# Patient Record
Sex: Female | Born: 1975 | Race: White | Hispanic: No | Smoking: Current every day smoker
Health system: Southern US, Community
[De-identification: ages and names within clinical notes are randomized; demographics above are authoritative.]

## PROBLEM LIST (undated history)

## (undated) DIAGNOSIS — D259 Leiomyoma of uterus, unspecified: Secondary | ICD-10-CM

## (undated) DIAGNOSIS — J45909 Unspecified asthma, uncomplicated: Secondary | ICD-10-CM

## (undated) HISTORY — PX: TUBAL LIGATION: SHX77

## (undated) HISTORY — PX: CHOLECYSTECTOMY: SHX55

## (undated) HISTORY — PX: ABDOMINAL HYSTERECTOMY: SHX81

---

## 2007-04-19 ENCOUNTER — Emergency Department (HOSPITAL_COMMUNITY): Admission: EM | Admit: 2007-04-19 | Discharge: 2007-04-19 | Payer: Self-pay | Admitting: Emergency Medicine

## 2007-05-02 ENCOUNTER — Emergency Department (HOSPITAL_COMMUNITY): Admission: EM | Admit: 2007-05-02 | Discharge: 2007-05-03 | Payer: Self-pay | Admitting: Emergency Medicine

## 2010-07-23 ENCOUNTER — Emergency Department (HOSPITAL_COMMUNITY)
Admission: EM | Admit: 2010-07-23 | Discharge: 2010-07-23 | Disposition: A | Discharge status: Home / Self Care | Payer: Self-pay | Attending: Emergency Medicine | Admitting: Emergency Medicine

## 2010-07-23 DIAGNOSIS — F17200 Nicotine dependence, unspecified, uncomplicated: Secondary | ICD-10-CM | POA: Insufficient documentation

## 2010-07-23 DIAGNOSIS — K219 Gastro-esophageal reflux disease without esophagitis: Secondary | ICD-10-CM | POA: Insufficient documentation

## 2010-07-23 DIAGNOSIS — S025XXA Fracture of tooth (traumatic), initial encounter for closed fracture: Secondary | ICD-10-CM | POA: Insufficient documentation

## 2010-07-23 DIAGNOSIS — X58XXXA Exposure to other specified factors, initial encounter: Secondary | ICD-10-CM | POA: Insufficient documentation

## 2014-01-18 ENCOUNTER — Encounter (HOSPITAL_COMMUNITY): Payer: Self-pay | Admitting: Emergency Medicine

## 2014-01-18 ENCOUNTER — Emergency Department (HOSPITAL_COMMUNITY)
Admission: EM | Admit: 2014-01-18 | Discharge: 2014-01-18 | Disposition: A | Discharge status: Home / Self Care | Payer: Self-pay | Attending: Emergency Medicine | Admitting: Emergency Medicine

## 2014-01-18 DIAGNOSIS — Z3202 Encounter for pregnancy test, result negative: Secondary | ICD-10-CM | POA: Insufficient documentation

## 2014-01-18 DIAGNOSIS — Z8742 Personal history of other diseases of the female genital tract: Secondary | ICD-10-CM | POA: Insufficient documentation

## 2014-01-18 DIAGNOSIS — M545 Low back pain, unspecified: Secondary | ICD-10-CM

## 2014-01-18 DIAGNOSIS — Z9049 Acquired absence of other specified parts of digestive tract: Secondary | ICD-10-CM | POA: Insufficient documentation

## 2014-01-18 DIAGNOSIS — Z86018 Personal history of other benign neoplasm: Secondary | ICD-10-CM

## 2014-01-18 DIAGNOSIS — G8929 Other chronic pain: Secondary | ICD-10-CM | POA: Insufficient documentation

## 2014-01-18 DIAGNOSIS — N939 Abnormal uterine and vaginal bleeding, unspecified: Secondary | ICD-10-CM | POA: Insufficient documentation

## 2014-01-18 DIAGNOSIS — Z720 Tobacco use: Secondary | ICD-10-CM | POA: Insufficient documentation

## 2014-01-18 DIAGNOSIS — Z9851 Tubal ligation status: Secondary | ICD-10-CM | POA: Insufficient documentation

## 2014-01-18 DIAGNOSIS — Z79899 Other long term (current) drug therapy: Secondary | ICD-10-CM | POA: Insufficient documentation

## 2014-01-18 HISTORY — DX: Unspecified asthma, uncomplicated: J45.909

## 2014-01-18 HISTORY — DX: Leiomyoma of uterus, unspecified: D25.9

## 2014-01-18 LAB — URINALYSIS, ROUTINE W REFLEX MICROSCOPIC
Bilirubin Urine: NEGATIVE
Glucose, UA: NEGATIVE mg/dL
Ketones, ur: NEGATIVE mg/dL
LEUKOCYTES UA: NEGATIVE
NITRITE: NEGATIVE
PH: 5.5 (ref 5.0–8.0)
Protein, ur: NEGATIVE mg/dL
Specific Gravity, Urine: <1.005 — ABNORMAL LOW (ref 1.005–1.030)
UROBILINOGEN UA: 0.2 mg/dL (ref 0.0–1.0)

## 2014-01-18 LAB — PREGNANCY, URINE: PREG TEST UR: NEGATIVE

## 2014-01-18 LAB — URINE MICROSCOPIC-ADD ON

## 2014-01-18 MED ORDER — OXYCODONE-ACETAMINOPHEN 5-325 MG PO TABS
1.0000 | ORAL_TABLET | Freq: Once | ORAL | Status: AC
  Administered 2014-01-18: 1 via ORAL
  Filled 2014-01-18: qty 1

## 2014-01-18 MED ORDER — HYDROCODONE-ACETAMINOPHEN 5-325 MG PO TABS: ORAL_TABLET | ORAL | Status: DC

## 2014-01-18 NOTE — ED Notes (Signed)
C/o uterine cramps since this am . Started period today. Told she had uterine fibroids and each menstrual cycle reports pain increase.

## 2014-01-18 NOTE — ED Provider Notes (Signed)
CSN: [id number]     Arrival date & time 01/18/14  1022 History   First MD Initiated Contact with Patient 01/18/14 1132     Chief Complaint  Patient presents with  . Pelvic Pain     (Consider location/radiation/quality/duration/timing/severity/associated sxs/prior Treatment) HPI   Terri Cohen is a 38 y.o. female with h/o chronic pelvic pain, who presents to the Emergency Department complaining of increased pelvic pain and low back pain since this morning.  She states that she has uterine fibroids and irregular periods and increased pain with menstruation each month.  She states that she was initially diagnosed at another hospital and the fibroids were confirmed by CT.  She has also seen a gynecologist for this and advised to have a  hysterectomy but she states that she cannot afford it and she is currently waiting to hear if she has been approved for financial assistance from [facility].  She states the pain is similar to previous and not relieved by OTC medications. She reports her bleeding as minimal at present.  She denies fever, abdominal pain, dysuria, vomiting incontinence of bladder or bowel, lower extremity numbness, or chills.     Past Medical History  Diagnosis Date  . Uterine fibroid   . Asthma    Past Surgical History  Procedure Laterality Date  . Tubal ligation    . Cholecystectomy     No family history on file. History  Substance Use Topics  . Smoking status: Current Every Day Smoker -- 0.50 packs/day    Types: Cigarettes  . Smokeless tobacco: Not on file  . Alcohol Use: No   OB History   Grav Para Term Preterm Abortions TAB SAB Ect Mult Living                 Review of Systems  Constitutional: Negative for fever, chills, activity change and appetite change.  Respiratory: Negative for chest tightness.   Cardiovascular: Negative for chest pain.  Gastrointestinal: Negative for nausea, vomiting and abdominal pain.  Genitourinary: Positive for vaginal bleeding,  menstrual problem and pelvic pain. Negative for dysuria, frequency and vaginal discharge.  Musculoskeletal: Positive for back pain.  Skin: Negative for rash.  Neurological: Negative for dizziness, weakness, numbness and headaches.  All other systems reviewed and are negative.     Allergies  Morphine and related and Sulfa antibiotics  Home Medications   Prior to Admission medications   Medication Sig Start Date End Date Taking? Authorizing Provider  ranitidine (ZANTAC 150 MAXIMUM STRENGTH) 150 MG tablet Take 150 mg by mouth 2 (two) times daily.   Yes Historical Provider, MD  HYDROcodone-acetaminophen (NORCO/VICODIN) 5-325 MG per tablet Take one-two tabs po q 4-6 hrs prn pain 01/18/14   Jair Lindblad L. Vanessa Calvin, PA-C   LMP 01/18/2014 Physical Exam  Nursing note and vitals reviewed. Constitutional: She is oriented to person, place, and time. She appears well-developed and well-nourished. No distress.  HENT:  Head: Normocephalic and atraumatic.  Neck: Normal range of motion. Neck supple.  Cardiovascular: Normal rate, regular rhythm, normal heart sounds and intact distal pulses.   No murmur heard. Pulmonary/Chest: Effort normal and breath sounds normal. No respiratory distress.  Abdominal: Soft. She exhibits no distension and no mass. There is no tenderness. There is no rebound and no guarding.  Musculoskeletal: Normal range of motion. She exhibits tenderness. She exhibits no edema.       Lumbar back: She exhibits tenderness and pain. She exhibits normal range of motion, no swelling, no deformity, no  laceration and normal pulse.  ttp of the bilateral  lumbar paraspinal muscles.  No spinal tenderness.  DP pulses are brisk and symmetrical.  Distal sensation intact.  Hip Flexors/Extensors are intact.  Pt has 5/5 strength against resistance of bilateral lower extremities.     Neurological: She is alert and oriented to person, place, and time. She has normal strength. No sensory deficit. She exhibits  normal muscle tone. Coordination and gait normal.  Reflex Scores:      Patellar reflexes are 2+ on the right side and 2+ on the left side.      Achilles reflexes are 2+ on the right side and 2+ on the left side. Skin: Skin is warm and dry. No rash noted.    ED Course  Procedures (including critical care time) Labs Review Labs Reviewed  URINALYSIS, ROUTINE W REFLEX MICROSCOPIC - Abnormal; Notable for the following:    Specific Gravity, Urine <1.005 (*)    Hgb urine dipstick SMALL (*)    All other components within normal limits  URINE MICROSCOPIC-ADD ON  PREGNANCY, URINE    Imaging Review No results found.   EKG Interpretation None      MDM   Final diagnoses:  History of uterine fibroid  Bilateral low back pain without sciatica    Patient has documented uterine fibroids seen on CT scan from 12/25/13 done at [facility].  Pt seen for this by OB/GYN in [facility] and recommended to have hysterectomy, but pt doesn't have insurance and cannot afford it.  She states the she is waiting for approval through [facility] assistance to f/u with [facility].  Pt is well appearing, non-toxic, ambulates with steady gait, no focal neuro deficits, no concerning sx's for acute abdomen or TOA.  Pain today is similar to previous symptoms and associated with onset of menses today therefore pelvic exam was deferred, but patient agrees to close f/u with GYN and she was advised to return here if sx's worsen.  Appears stable for d/c    Maryse Brierley L. Vanessa McCurtain, PA-C 01/20/14 1217

## 2014-01-18 NOTE — ED Notes (Signed)
Patient given discharge instruction, verbalized understand. Patient ambulatory out of the department.  

## 2014-01-21 NOTE — ED Provider Notes (Signed)
Medical screening examination/treatment/procedure(s) were conducted as a shared visit with non-physician practitioner(s) and myself.  I personally evaluated the patient during the encounter.   EKG Interpretation None     Patient has known uterine fibroids. She is waiting for gynecological followup. No acute abdomen. Pain is similar to the pain she has had in the past.  Nat Christen, MD 01/21/14 1055

## 2014-04-05 ENCOUNTER — Emergency Department (HOSPITAL_COMMUNITY)
Admission: EM | Admit: 2014-04-05 | Discharge: 2014-04-05 | Disposition: A | Discharge status: Home / Self Care | Payer: Self-pay | Attending: Emergency Medicine | Admitting: Emergency Medicine

## 2014-04-05 ENCOUNTER — Encounter (HOSPITAL_COMMUNITY): Payer: Self-pay | Admitting: Emergency Medicine

## 2014-04-05 DIAGNOSIS — Z8742 Personal history of other diseases of the female genital tract: Secondary | ICD-10-CM | POA: Insufficient documentation

## 2014-04-05 DIAGNOSIS — R5383 Other fatigue: Secondary | ICD-10-CM | POA: Insufficient documentation

## 2014-04-05 DIAGNOSIS — Z791 Long term (current) use of non-steroidal anti-inflammatories (NSAID): Secondary | ICD-10-CM | POA: Insufficient documentation

## 2014-04-05 DIAGNOSIS — R630 Anorexia: Secondary | ICD-10-CM | POA: Insufficient documentation

## 2014-04-05 DIAGNOSIS — J40 Bronchitis, not specified as acute or chronic: Secondary | ICD-10-CM

## 2014-04-05 DIAGNOSIS — Z720 Tobacco use: Secondary | ICD-10-CM | POA: Insufficient documentation

## 2014-04-05 DIAGNOSIS — Z79899 Other long term (current) drug therapy: Secondary | ICD-10-CM | POA: Insufficient documentation

## 2014-04-05 DIAGNOSIS — J45901 Unspecified asthma with (acute) exacerbation: Secondary | ICD-10-CM | POA: Insufficient documentation

## 2014-04-05 DIAGNOSIS — R0789 Other chest pain: Secondary | ICD-10-CM | POA: Insufficient documentation

## 2014-04-05 MED ORDER — ALBUTEROL SULFATE (2.5 MG/3ML) 0.083% IN NEBU
2.5000 mg | INHALATION_SOLUTION | Freq: Once | RESPIRATORY_TRACT | Status: AC
  Administered 2014-04-05: 2.5 mg via RESPIRATORY_TRACT
  Filled 2014-04-05: qty 3

## 2014-04-05 MED ORDER — IPRATROPIUM-ALBUTEROL 0.5-2.5 (3) MG/3ML IN SOLN
3.0000 mL | Freq: Once | RESPIRATORY_TRACT | Status: AC
  Administered 2014-04-05: 3 mL via RESPIRATORY_TRACT
  Filled 2014-04-05: qty 3

## 2014-04-05 MED ORDER — CLARITHROMYCIN 500 MG PO TABS: 500.0000 mg | ORAL_TABLET | Freq: Two times a day (BID) | ORAL | Status: DC

## 2014-04-05 MED ORDER — PROMETHAZINE-CODEINE 6.25-10 MG/5ML PO SYRP: 5.0000 mL | ORAL_SOLUTION | ORAL | Status: DC | PRN

## 2014-04-05 MED ORDER — PREDNISONE 50 MG PO TABS
60.0000 mg | ORAL_TABLET | Freq: Once | ORAL | Status: AC
  Administered 2014-04-05: 60 mg via ORAL
  Filled 2014-04-05 (×2): qty 1

## 2014-04-05 NOTE — ED Notes (Signed)
PA at bedside.

## 2014-04-05 NOTE — ED Notes (Signed)
Pt c/o productive cough 2-3 months with yellow/green sputum, general malaise, nausea and poor appetite.

## 2014-04-05 NOTE — ED Notes (Signed)
RT paged about breathing treatment

## 2014-04-05 NOTE — ED Provider Notes (Signed)
CSN: [id number]     Arrival date & time 04/05/14  1005 History   First MD Initiated Contact with Patient 04/05/14 1010     Chief Complaint  Patient presents with  . Cough     (Consider location/radiation/quality/duration/timing/severity/associated sxs/prior Treatment) HPI   Terri Cohen is a 38 y.o. female who presents to the Emergency Department complaining of cough for 2 months.  She states the cough is persistent and occasionally productive of yellow to green sputum.  She states the cough is worse with lying down. She also c/o generalized fatigue, nasal congestion, nausea without vomiting, and decreased appetite.  She states that she has taken OTC cold and cough medications without relief.  She has also been using her albuterol inhaler without relief.  She denies fever, chills, vomiting, bloody sputum, shortness of breath of chest pain.     Past Medical History  Diagnosis Date  . Uterine fibroid   . Asthma    Past Surgical History  Procedure Laterality Date  . Tubal ligation    . Cholecystectomy     No family history on file. History  Substance Use Topics  . Smoking status: Current Every Day Smoker -- 0.50 packs/day    Types: Cigarettes  . Smokeless tobacco: Not on file  . Alcohol Use: No   OB History    No data available     Review of Systems  Constitutional: Positive for appetite change and fatigue. Negative for fever and chills.  HENT: Positive for congestion. Negative for sinus pressure, sore throat and trouble swallowing.   Respiratory: Positive for cough, chest tightness and wheezing. Negative for shortness of breath.   Cardiovascular: Negative for chest pain.  Gastrointestinal: Negative for nausea, vomiting and abdominal pain.  Genitourinary: Negative for dysuria.  Musculoskeletal: Negative for arthralgias, neck pain and neck stiffness.  Skin: Negative for rash.  Neurological: Negative for dizziness, weakness and numbness.  Hematological: Negative for  adenopathy.  All other systems reviewed and are negative.     Allergies  Sulfa antibiotics  Home Medications   Prior to Admission medications   Medication Sig Start Date End Date Taking? Authorizing Provider  acetaminophen (TYLENOL) 500 MG tablet Take 1,000 mg by mouth every 6 (six) hours as needed for moderate pain.   Yes Historical Provider, MD  ranitidine (ZANTAC 150 MAXIMUM STRENGTH) 150 MG tablet Take 150 mg by mouth 2 (two) times daily.   Yes Historical Provider, MD  vitamin B-12 (CYANOCOBALAMIN) 1000 MCG tablet Take 1,000 mcg by mouth daily.   Yes Historical Provider, MD  HYDROcodone-acetaminophen (NORCO/VICODIN) 5-325 MG per tablet Take one-two tabs po q 4-6 hrs prn pain Patient not taking: Reported on 04/05/2014 01/18/14   Hollyann Pablo L. Donyelle Enyeart, PA-C   BP 136/80 mmHg  Pulse 70  Temp(Src) 98.1 F (36.7 C)  Resp 22  Ht 5\' 4"  (1.626 m)  Wt 170 lb (77.111 kg)  BMI 29.17 kg/m2  SpO2 99%  LMP 03/12/2014 Physical Exam  Constitutional: She is oriented to person, place, and time. She appears well-developed and well-nourished. No distress.  HENT:  Head: Normocephalic and atraumatic.  Mouth/Throat: Oropharynx is clear and moist. No oropharyngeal exudate.  Neck: Normal range of motion. Neck supple.  Pulmonary/Chest: Effort normal. No respiratory distress. She has wheezes. She has no rales. She exhibits no tenderness.  Course lung sounds bilaterally with occasional expiratory wheezes bilaterally.  No rales  Abdominal: Soft. She exhibits no distension. There is no tenderness. There is no rebound and no guarding.  Musculoskeletal: Normal range of motion.  Lymphadenopathy:    She has no cervical adenopathy.  Neurological: She is alert and oriented to person, place, and time. She exhibits normal muscle tone. Coordination normal.  Skin: Skin is warm and dry. No rash noted.  Psychiatric: She has a normal mood and affect.  Nursing note and vitals reviewed.   ED Course  Procedures  (including critical care time) Labs Review Labs Reviewed - No data to display  Imaging Review No results found.   EKG Interpretation None      MDM   Final diagnoses:  Bronchitis    Vitals are stable.  No hypoxia, tachypnea or tachycardia.  Lung sounds improved after neb and prednisone and pt is ready for discharge.  Pt has her own albuterol MDI.  Agrees to 2 puffs every 4 hrs as needed, phenergan codeine cough syrup, and biaxin. Advised to f/u with her PMD or to return here for any worsening symptoms.  She agrees to plan and verbalized understanding.    Terri Cohen 04/06/14 [place], MD 04/06/14 [place]ly [place]

## 2015-07-02 ENCOUNTER — Emergency Department (HOSPITAL_COMMUNITY): Payer: Self-pay

## 2015-07-02 ENCOUNTER — Encounter (HOSPITAL_COMMUNITY): Payer: Self-pay | Admitting: Emergency Medicine

## 2015-07-02 ENCOUNTER — Emergency Department (HOSPITAL_COMMUNITY)
Admission: EM | Admit: 2015-07-02 | Discharge: 2015-07-02 | Disposition: A | Discharge status: Home / Self Care | Payer: Self-pay | Attending: Emergency Medicine | Admitting: Emergency Medicine

## 2015-07-02 DIAGNOSIS — J40 Bronchitis, not specified as acute or chronic: Secondary | ICD-10-CM

## 2015-07-02 DIAGNOSIS — J45909 Unspecified asthma, uncomplicated: Secondary | ICD-10-CM | POA: Insufficient documentation

## 2015-07-02 DIAGNOSIS — F17210 Nicotine dependence, cigarettes, uncomplicated: Secondary | ICD-10-CM | POA: Insufficient documentation

## 2015-07-02 DIAGNOSIS — J449 Chronic obstructive pulmonary disease, unspecified: Secondary | ICD-10-CM | POA: Insufficient documentation

## 2015-07-02 MED ORDER — CEPHALEXIN 500 MG PO CAPS: 500.0000 mg | ORAL_CAPSULE | Freq: Four times a day (QID) | ORAL | Status: DC

## 2015-07-02 MED ORDER — PREDNISONE 10 MG PO TABS: ORAL_TABLET | ORAL | Status: DC

## 2015-07-02 MED ORDER — IPRATROPIUM-ALBUTEROL 0.5-2.5 (3) MG/3ML IN SOLN: 3.0000 mL | Freq: Four times a day (QID) | RESPIRATORY_TRACT | Status: DC

## 2015-07-02 MED ORDER — PREDNISONE 50 MG PO TABS: 60.0000 mg | ORAL_TABLET | Freq: Every day | ORAL | Status: DC

## 2015-07-02 NOTE — ED Notes (Signed)
Pt refused Duoneb and prednisone.  Pt did not want to wait for respiratory

## 2015-07-02 NOTE — Discharge Instructions (Signed)

## 2015-07-02 NOTE — ED Provider Notes (Signed)
CSN: [id number]     Arrival date & time 07/02/15  1240 History  By signing my name below, I, Terri Cohen, attest that this documentation has been prepared under the direction and in the presence of Terri Hashimoto, PA-C. Electronically Signed: Meriel Cohen, ED Scribe. 07/02/2015. 2:15 PM.   Chief Complaint  Patient presents with  . Cough   Patient is a 40 y.o. female presenting with cough. The history is provided by the patient. No language interpreter was used.  Cough Severity:  Moderate Duration: 6 months. Timing:  Constant Progression:  Unchanged Smoker: yes   Context: smoke exposure   Relieved by:  Nothing Ineffective treatments:  Cough suppressants and beta-agonist inhaler Associated symptoms: no chills and no fever     HPI Comments: Terri Cohen is a 40 y.o. female, with a h/o asthma and bronchitis, who presents to the Emergency Department complaining of a cough that has been persistent for 6 months, that is worse at night and interferes with sleep.  Pt reports she was diagnosed with pertussis 'a few months ago' and was subsequently re-vaccinated. She is a current daily smoker. Pt has an albuterol inhaler at home that has not provided significant relief. She also notes OTC cough suppressants have not provided relief. She denies fevers or chills. She is not followed by a PCP. Sulfa allergy.   Past Medical History  Diagnosis Date  . Uterine fibroid   . Asthma    Past Surgical History  Procedure Laterality Date  . Tubal ligation    . Cholecystectomy    . Abdominal hysterectomy      partial   No family history on file. Social History  Substance Use Topics  . Smoking status: Current Every Day Smoker -- 1.00 packs/day    Types: Cigarettes  . Smokeless tobacco: None  . Alcohol Use: Yes     Comment: occ   OB History    No data available     Review of Systems  Constitutional: Negative for fever and chills.  Respiratory: Positive for cough.   All other  systems reviewed and are negative.  Allergies  Sulfa antibiotics  Home Medications   Prior to Admission medications   Medication Sig Start Date End Date Taking? Authorizing Provider  acetaminophen (TYLENOL) 500 MG tablet Take 1,000 mg by mouth every 6 (six) hours as needed for moderate pain.    Historical Provider, MD  cephALEXin (KEFLEX) 500 MG capsule Take 1 capsule (500 mg total) by mouth 4 (four) times daily. 07/02/15   Fransico Meadow, PA-C  clarithromycin (BIAXIN) 500 MG tablet Take 1 tablet (500 mg total) by mouth 2 (two) times daily. For 10 days 04/05/14   Tammy Triplett, PA-C  predniSONE (DELTASONE) 10 MG tablet 6,5,4,3,2,1 taper 07/02/15   Fransico Meadow, PA-C  promethazine-codeine [facility] WITH CODEINE) 6.25-10 MG/5ML syrup Take 5 mLs by mouth every 4 (four) hours as needed for cough. 04/05/14   Tammy Triplett, PA-C  ranitidine (ZANTAC 150 MAXIMUM STRENGTH) 150 MG tablet Take 150 mg by mouth 2 (two) times daily.    Historical Provider, MD  vitamin B-12 (CYANOCOBALAMIN) 1000 MCG tablet Take 1,000 mcg by mouth daily.    Historical Provider, MD   BP 122/83 mmHg  Pulse 104  Temp(Src) 98 F (36.7 C) (Oral)  Resp 20  Ht 5\' 4"  (1.626 m)  Wt 170 lb (77.111 kg)  BMI 29.17 kg/m2  SpO2 97%  LMP 03/12/2014 Physical Exam  Constitutional: She is oriented to person,  place, and time. She appears well-developed and well-nourished. No distress.  Smells strongly of cigarette smoke.   HENT:  Head: Normocephalic.  Eyes: Conjunctivae are normal.  Neck: Normal range of motion. Neck supple.  Cardiovascular: Normal rate, regular rhythm and normal heart sounds.   Pulmonary/Chest: Effort normal. No respiratory distress. She has wheezes.  Musculoskeletal: Normal range of motion.  Neurological: She is alert and oriented to person, place, and time. Coordination normal.  Skin: Skin is warm.  Psychiatric: She has a normal mood and affect. Her behavior is normal.  Nursing note and vitals  reviewed.   ED Course  Procedures  DIAGNOSTIC STUDIES: Oxygen Saturation is 97% on RA, normal by my interpretation.    COORDINATION OF CARE: 2:09 PM Discussed treatment plan with pt at bedside which includes to order a breathing treatment and prescribe an antibiotic course and short course of prednisone. Pt is agreeable to plan.  2:35 PM Pt comes out of room and states she has to leave and does not want to wait for albuterol nebulizer and prednisone here. She is actively wheezing.   Imaging Review Dg Chest 2 View  07/02/2015  CLINICAL DATA:  Productive cough EXAM: CHEST  2 VIEW COMPARISON:  03/09/2015 FINDINGS: The heart size and mediastinal contours are within normal limits. Both lungs are clear. The visualized skeletal structures are unremarkable. IMPRESSION: No active cardiopulmonary disease. Electronically Signed   By: Franchot Gallo M.D.   On: 07/02/2015 13:33   I have personally reviewed and evaluated these images results as part of my medical decision-making.   MDM   Final diagnoses:  Bronchitis    Meds ordered this encounter  Medications  . ipratropium-albuterol (DUONEB) 0.5-2.5 (3) MG/3ML nebulizer solution 3 mL    Sig:   . predniSONE (DELTASONE) tablet 60 mg    Sig:   . cephALEXin (KEFLEX) 500 MG capsule    Sig: Take 1 capsule (500 mg total) by mouth 4 (four) times daily.    Dispense:  40 capsule    Refill:  0    Order Specific Question:  Supervising Provider    Answer:  MILLER, BRIAN [[id number]]  . predniSONE (DELTASONE) 10 MG tablet    Sig: 6,5,4,3,2,1 taper    Dispense:  21 tablet    Refill:  0    Order Specific Question:  Supervising Provider    Answer:  Noemi Chapel [[id number]]   An After Visit Summary was printed and given to the patient. I personally performed the services in this documentation, which was scribed in my presence.  The recorded information has been reviewed and considered.   Terri Cohen.   Terri Cohen [place], PA-C 07/02/15 1617  Noemi Chapel,  MD 07/04/15 [phone]

## 2015-07-02 NOTE — ED Notes (Addendum)
Pt reports ongoing SOB and productive cough x 6 months. Denies n/v/d. Denies fever. Pt states she was diagnosed with Pertussis a few months ago. States she was re-vaccinated.

## 2017-09-25 IMAGING — DX DG CHEST 2V
2 series · 2 of 2 positions shown · non-contrast
Comparison: 03/09/2015

CLINICAL DATA: Productive cough

EXAM:
CHEST  2 VIEW

[chest pa]
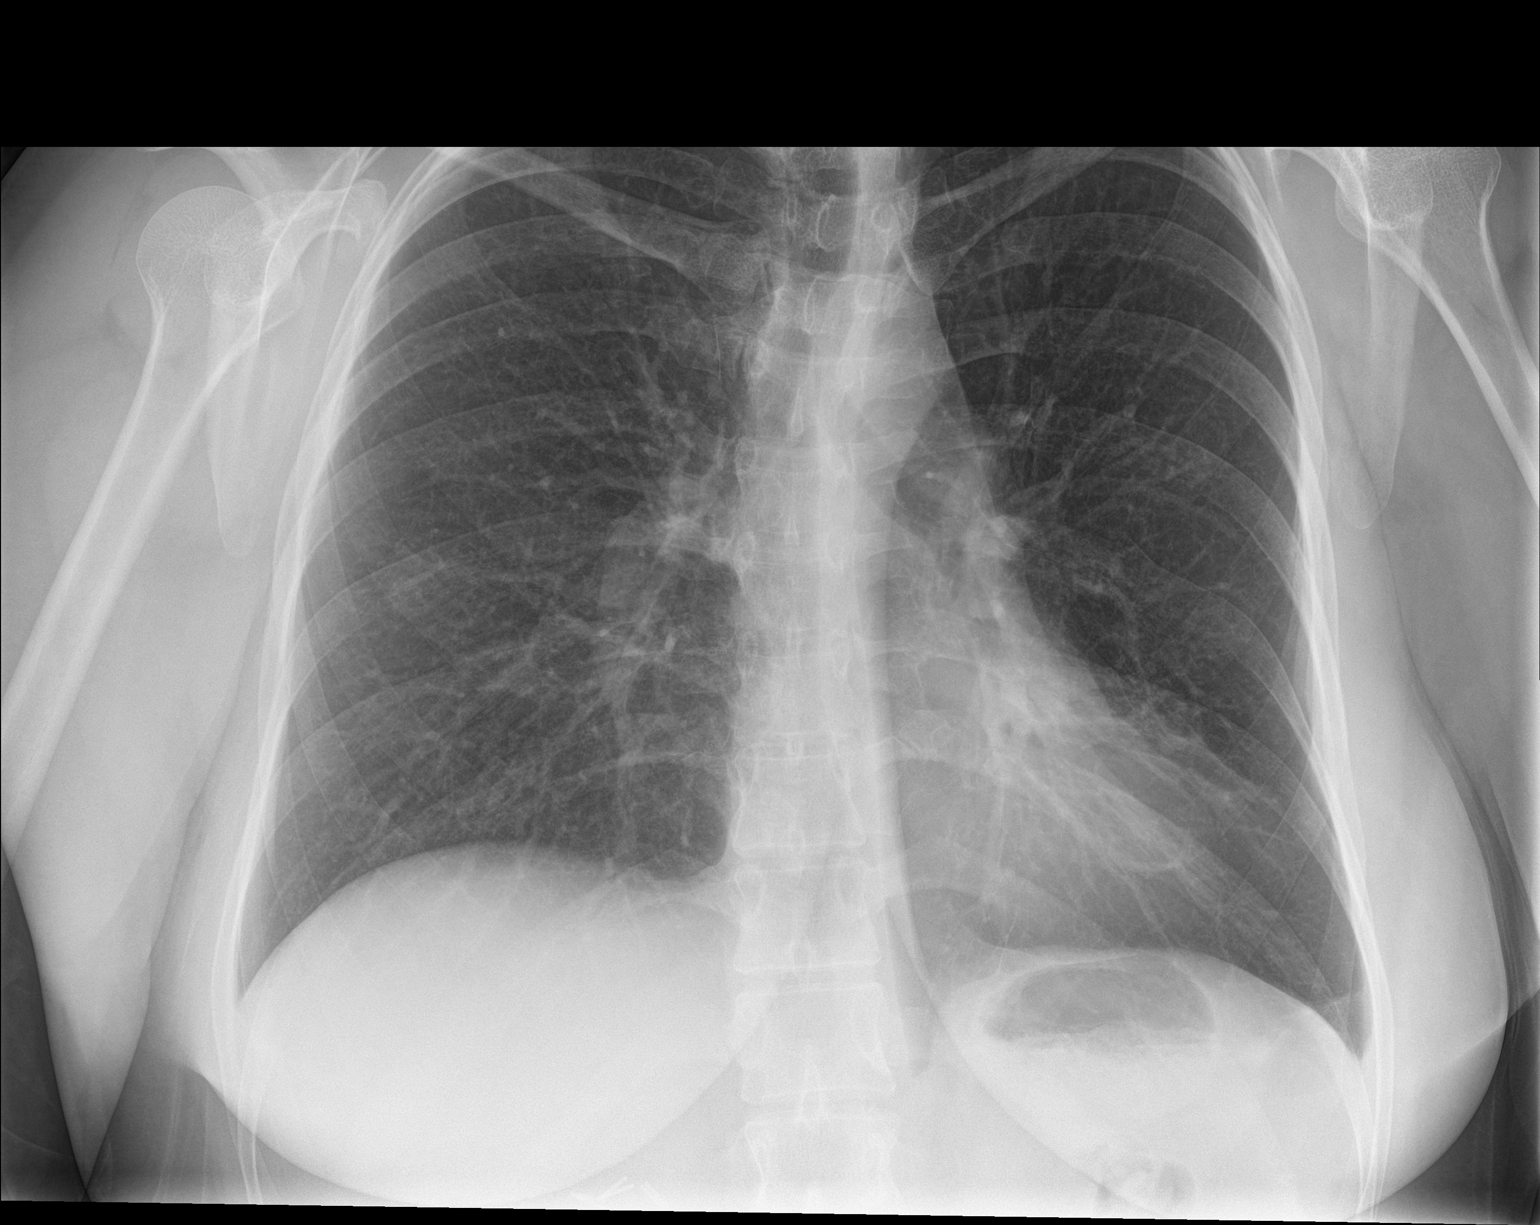

[chest lat]
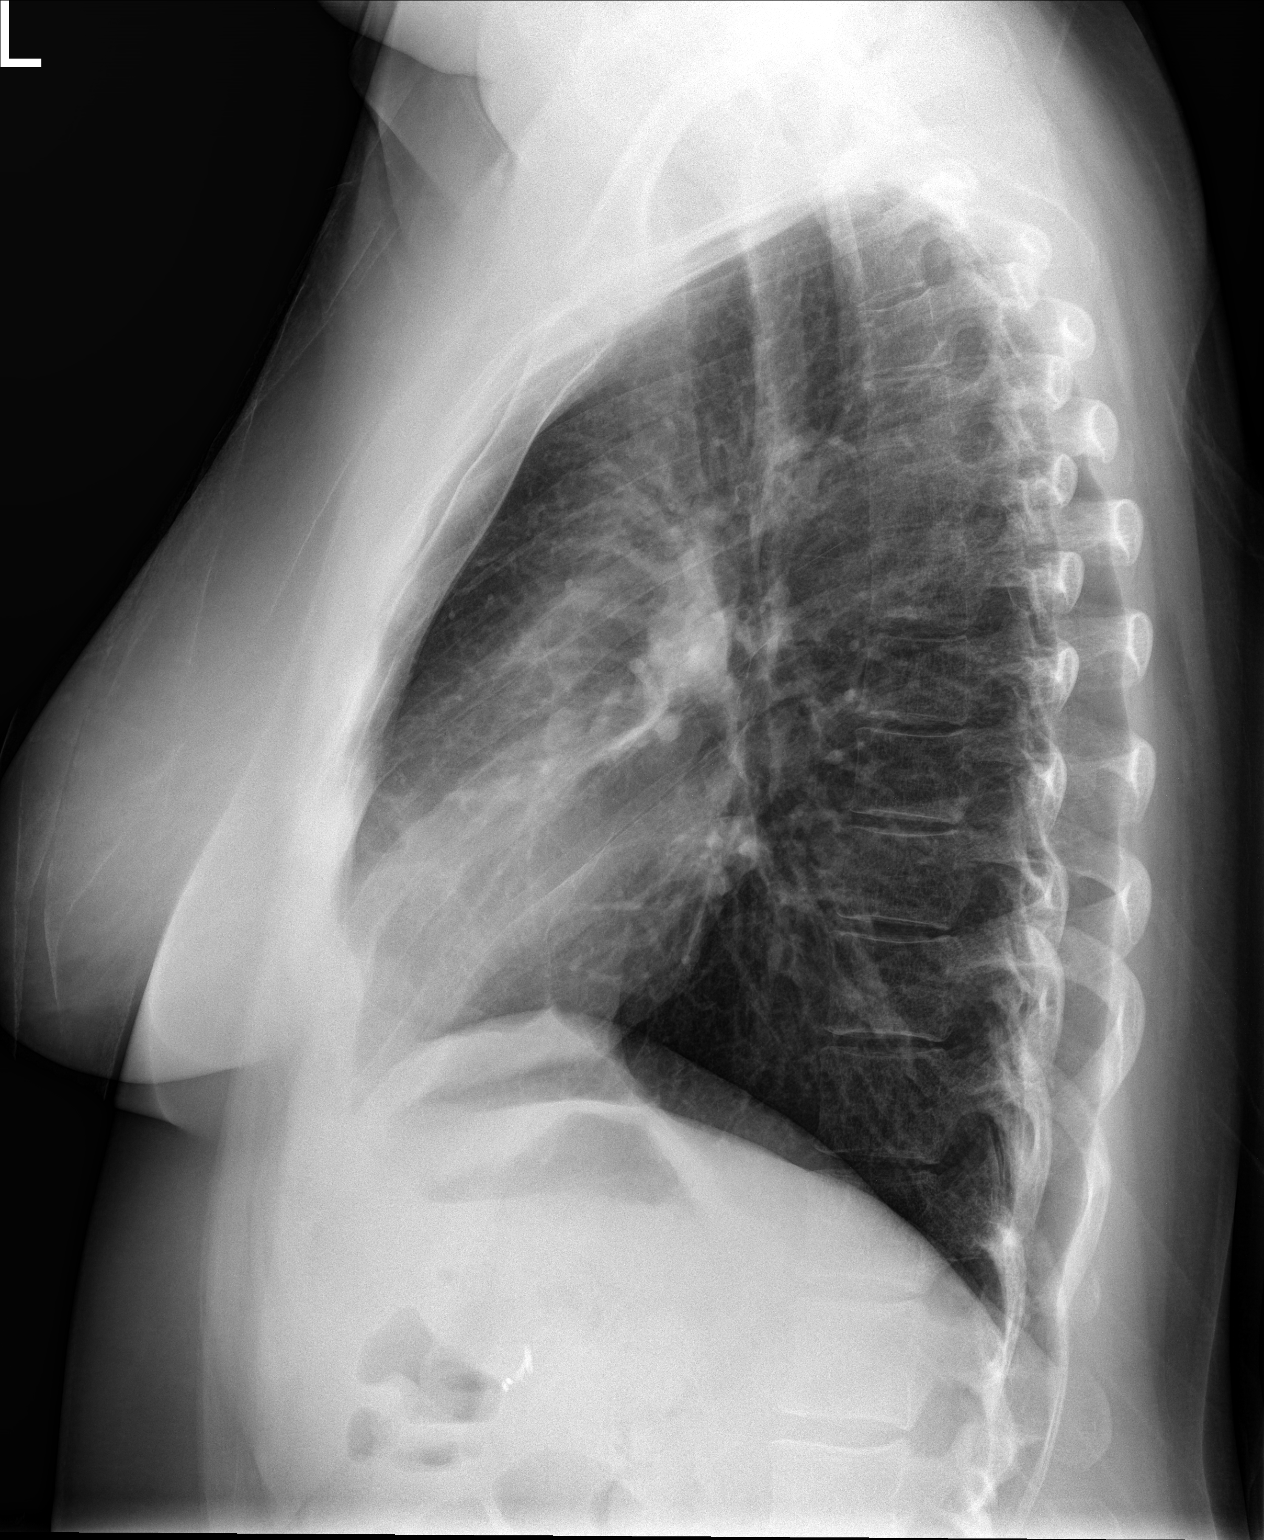

[2 of 2 positions shown; findings below may reference images not displayed]

FINDINGS: The heart size and mediastinal contours are within normal limits.
Both lungs are clear. The visualized skeletal structures are
unremarkable.
IMPRESSION: No active cardiopulmonary disease.

## 2020-03-14 DEATH — deceased
